# Patient Record
Sex: Male | Born: 1985 | Race: White | Hispanic: No | Marital: Married | Smoking: Never smoker
Health system: Southern US, Community
[De-identification: ages and names within clinical notes are randomized; demographics above are authoritative.]

## PROBLEM LIST (undated history)

## (undated) HISTORY — PX: FRENULECTOMY, LINGUAL: SHX1681

## (undated) HISTORY — PX: ADENOIDECTOMY: SHX5191

---

## 2006-06-30 ENCOUNTER — Ambulatory Visit: Payer: Self-pay | Admitting: Physician Assistant

## 2008-03-25 ENCOUNTER — Encounter: Admission: RE | Admit: 2008-03-25 | Discharge: 2008-03-25 | Payer: Self-pay | Admitting: Sports Medicine

## 2016-08-05 DIAGNOSIS — H5213 Myopia, bilateral: Secondary | ICD-10-CM

## 2016-08-05 HISTORY — DX: Myopia, bilateral: H52.13

## 2017-06-05 DIAGNOSIS — Z0279 Encounter for issue of other medical certificate: Secondary | ICD-10-CM

## 2017-06-05 HISTORY — DX: Encounter for issue of other medical certificate: Z02.79

## 2021-06-24 ENCOUNTER — Encounter (HOSPITAL_COMMUNITY): Payer: Self-pay | Admitting: *Deleted

## 2021-06-24 ENCOUNTER — Emergency Department (HOSPITAL_COMMUNITY): Payer: No Typology Code available for payment source

## 2021-06-24 ENCOUNTER — Emergency Department (HOSPITAL_COMMUNITY)
Admission: EM | Admit: 2021-06-24 | Discharge: 2021-06-24 | Disposition: A | Discharge status: Home / Self Care | Payer: No Typology Code available for payment source | Attending: Emergency Medicine | Admitting: Emergency Medicine

## 2021-06-24 DIAGNOSIS — Y9301 Activity, walking, marching and hiking: Secondary | ICD-10-CM | POA: Diagnosis not present

## 2021-06-24 DIAGNOSIS — R110 Nausea: Secondary | ICD-10-CM | POA: Insufficient documentation

## 2021-06-24 DIAGNOSIS — X58XXXA Exposure to other specified factors, initial encounter: Secondary | ICD-10-CM | POA: Insufficient documentation

## 2021-06-24 DIAGNOSIS — R55 Syncope and collapse: Secondary | ICD-10-CM | POA: Diagnosis not present

## 2021-06-24 DIAGNOSIS — R1031 Right lower quadrant pain: Secondary | ICD-10-CM | POA: Insufficient documentation

## 2021-06-24 DIAGNOSIS — Y990 Civilian activity done for income or pay: Secondary | ICD-10-CM | POA: Insufficient documentation

## 2021-06-24 DIAGNOSIS — R001 Bradycardia, unspecified: Secondary | ICD-10-CM | POA: Diagnosis not present

## 2021-06-24 DIAGNOSIS — R61 Generalized hyperhidrosis: Secondary | ICD-10-CM | POA: Insufficient documentation

## 2021-06-24 LAB — BASIC METABOLIC PANEL
Anion gap: 9 (ref 5–15)
BUN: 16 mg/dL (ref 6–20)
CO2: 22 mmol/L (ref 22–32)
Calcium: 9.3 mg/dL (ref 8.9–10.3)
Chloride: 108 mmol/L (ref 98–111)
Creatinine, Ser: 0.91 mg/dL (ref 0.61–1.24)
GFR, Estimated: >60 mL/min (ref >60)
Glucose, Bld: 143 mg/dL — ABNORMAL HIGH (ref 70–99)
Potassium: 3.4 mmol/L — ABNORMAL LOW (ref 3.5–5.1)
Sodium: 139 mmol/L (ref 135–145)

## 2021-06-24 LAB — CBC
HCT: 40.7 % (ref 39.0–52.0)
Hemoglobin: 14.1 g/dL (ref 13.0–17.0)
MCH: 30.5 pg (ref 26.0–34.0)
MCHC: 34.6 g/dL (ref 30.0–36.0)
MCV: 87.9 fL (ref 80.0–100.0)
Platelets: 169 10*3/uL (ref 150–400)
RBC: 4.63 MIL/uL (ref 4.22–5.81)
RDW: 11.7 % (ref 11.5–15.5)
WBC: 8.6 10*3/uL (ref 4.0–10.5)
nRBC: 0 % (ref 0.0–0.2)

## 2021-06-24 LAB — URINALYSIS, ROUTINE W REFLEX MICROSCOPIC
Bilirubin Urine: NEGATIVE
Glucose, UA: NEGATIVE mg/dL
Hgb urine dipstick: NEGATIVE
Ketones, ur: 20 mg/dL — AB
Leukocytes,Ua: NEGATIVE
Nitrite: NEGATIVE
Protein, ur: NEGATIVE mg/dL
Specific Gravity, Urine: 1.02 (ref 1.005–1.030)
pH: 6 (ref 5.0–8.0)

## 2021-06-24 LAB — HEPATIC FUNCTION PANEL
ALT: 33 U/L (ref 0–44)
AST: 31 U/L (ref 15–41)
Albumin: 4.9 g/dL (ref 3.5–5.0)
Alkaline Phosphatase: 44 U/L (ref 38–126)
Bilirubin, Direct: 0.1 mg/dL (ref 0.0–0.2)
Indirect Bilirubin: 0.5 mg/dL (ref 0.3–0.9)
Total Bilirubin: 0.6 mg/dL (ref 0.3–1.2)
Total Protein: 7.2 g/dL (ref 6.5–8.1)

## 2021-06-24 LAB — TROPONIN I (HIGH SENSITIVITY): Troponin I (High Sensitivity): 6 ng/L (ref <18)

## 2021-06-24 LAB — LIPASE, BLOOD: Lipase: 28 U/L (ref 11–51)

## 2021-06-24 NOTE — Discharge Instructions (Addendum)
You came to the emergency department today to be evaluated for your episode of syncope.  Your physical exam, lab work, chest x-ray, and EKG were reassuring.  Please follow-up with your primary care provider within appointment as soon as possible.    Get help right away if you: Have a severe headache. Faint once or repeatedly. Have pain in your chest, abdomen, or back. Have a very fast or irregular heartbeat (palpitations). Have pain when you breathe. Are bleeding from your mouth or rectum, or you have black or tarry stool. Have a seizure. Are confused. Have trouble walking. Have severe weakness. Have vision problems.

## 2021-06-24 NOTE — ED Provider Notes (Signed)
[facility]-EMERGENCY DEPT Provider Note   CSN: [id number] Arrival date & time: 06/24/21  1135     History Chief Complaint  Patient presents with   Loss of Consciousness    Cole Baird is a 35 y.o. male with no reported medical history.  Patient presents to the emergency room with a chief complaint of syncope.  Patient reports that syncopal episode occurred after he was walking outside in the heat for approximately 1 and half hours.  Patient reports that episode occurred approximately 1030.  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  He started feeling lightheaded and nauseous.  Had episode of syncope.  Syncopal episode was witnessed by his coworker.  Syncopal episode lasted approximately a few minutes.  No reported seizure-like activity.  Patient was able to stand and ambulate after the event.  Continued to have lightheadedness r his syncopal episode however these symptoms resolved after receiving 500 mL fluid with EMS in route to emergency department.  Patient endorsed having right lower quadrant abdominal discomfort prior to his episode of syncope.  Patient had no associated chest pain, shortness of breath, palpitations, sudden onset of headache, vomiting.  Patient denies any history of PE/DVT, surgery in the last 12 weeks, cancer treatment, hormone therapy use, hemoptysis, unilateral leg swelling or tenderness.   Patient denies illicit drug use, tobacco use.  Reports occasional alcohol use.   Loss of Consciousness Associated symptoms: diaphoresis and nausea   Associated symptoms: no chest pain, no confusion, no dizziness, no fever, no headaches, no palpitations, no seizures, no shortness of breath, no vomiting and no weakness       History reviewed. No pertinent past medical history.  There are no problems to display for this patient.   History reviewed. No pertinent surgical  history.     No family history on file.     Home Medications Prior to Admission medications   Not on File    Allergies    Patient has no allergy information on record.  Review of Systems   Review of Systems  Constitutional:  Positive for diaphoresis. Negative for chills and fever.  Eyes:  Negative for visual disturbance.  Respiratory:  Negative for cough and shortness of breath.   Cardiovascular:  Positive for syncope. Negative for chest pain, palpitations and leg swelling.  Gastrointestinal:  Positive for abdominal pain and nausea. Negative for vomiting.  Genitourinary:  Negative for difficulty urinating.  Musculoskeletal:  Negative for back pain and neck pain.  Skin:  Negative for color change and rash.  Allergic/Immunologic: Negative for immunocompromised state.  Neurological:  Positive for syncope and light-headedness. Negative for dizziness, tremors, seizures, facial asymmetry, speech difficulty, weakness, numbness and headaches.  Psychiatric/Behavioral:  Negative for confusion.    Physical Exam Updated Vital Signs BP (!) 135/92   Pulse (!) 56   Temp 97.6 F (36.4 C) (Oral)   Resp 18   SpO2 99%   Physical Exam Vitals and nursing note reviewed.  Constitutional:      General: He is not in acute distress.    Appearance: He is not ill-appearing, toxic-appearing or diaphoretic.  HENT:     Head: Normocephalic and atraumatic.  Eyes:     General: No scleral icterus.       Right eye: No discharge.        Left eye: No discharge.  Cardiovascular:     Rate and  Rhythm: Bradycardia present.     Pulses:          Radial pulses are 2+ on the right side and 2+ on the left side.       Femoral pulses are 2+ on the right side and 2+ on the left side.    Heart sounds: Normal heart sounds.     Comments: Bradycardic at rate of 56 Pulmonary:     Effort: Pulmonary effort is normal. No tachypnea, bradypnea or respiratory distress.     Breath sounds: Normal breath sounds. No  stridor.  Abdominal:     General: Abdomen is flat. Bowel sounds are normal. There is no distension. There are no signs of injury.     Palpations: Abdomen is soft. There is no mass or pulsatile mass.     Tenderness: There is no abdominal tenderness. There is no guarding or rebound. Negative signs include McBurney's sign.     Hernia: There is no hernia in the umbilical area or ventral area.  Musculoskeletal:     Cervical back: Neck supple.     Right lower leg: No swelling or tenderness. No edema.     Left lower leg: No swelling or tenderness. No edema.  Skin:    General: Skin is warm and dry.  Neurological:     General: No focal deficit present.     Mental Status: He is alert and oriented to person, place, and time.     GCS: GCS eye subscore is 4. GCS verbal subscore is 5. GCS motor subscore is 6.  Psychiatric:        Behavior: Behavior is cooperative.    ED Results / Procedures / Treatments   Labs (all labs ordered are listed, but only abnormal results are displayed) Labs Reviewed  BASIC METABOLIC PANEL - Abnormal; Notable for the following components:      Result Value   Potassium 3.4 (*)    Glucose, Bld 143 (*)    All other components within normal limits  URINALYSIS, ROUTINE W REFLEX MICROSCOPIC - Abnormal; Notable for the following components:   Ketones, ur 20 (*)    All other components within normal limits  CBC  HEPATIC FUNCTION PANEL  LIPASE, BLOOD  TROPONIN I (HIGH SENSITIVITY)    EKG EKG Interpretation  Date/Time:  Thursday June 24 2021 11:50:56 EDT Ventricular Rate:  58 PR Interval:  189 QRS Duration: 97 QT Interval:  421 QTC Calculation: 414 R Axis:   82 Text Interpretation: Sinus rhythm Minimal ST depression, inferior leads 12 Lead; Mason-Likar No previous tracing Confirmed by Gwyneth Sprout ([id number]) on 06/24/2021 1:17:17 PM  Radiology DG Chest 2 View  Result Date: 06/24/2021 CLINICAL DATA:  Syncope. EXAM: CHEST - 2 VIEW COMPARISON:  None. FINDINGS:  The heart size and mediastinal contours are within normal limits. Both lungs are clear. No visible pleural effusions or pneumothorax on these semi-erect radiographs. No acute osseous abnormality. IMPRESSION: No evidence of acute cardiopulmonary disease. Electronically Signed   By: Feliberto Harts MD   On: 06/24/2021 13:51    Procedures Procedures   Medications Ordered in ED Medications - No data to display  ED Course  I have reviewed the triage vital signs and the nursing notes.  Pertinent labs & imaging results that were available during my care of the patient were reviewed by me and considered in my medical decision making (see chart for details).    MDM Rules/Calculators/A&P  Alert 35 year old male in no acute distress, nontoxic-appearing.  Patient presents emerged part with a chief complaint of syncopal episode.  He denies any medical history.  No illicit drug use, tobacco use.  Patient denies any symptoms of lightheadedness at present.    Patient was alert ambulatory after syncopal episode.  No fall or traumatic injury associated with syncopal episode.  Syncope was witnessed by coworker, no No reported seizure-like activity.  Solution of lightheadedness after receiving 500 mL fluid bolus with EMS.  CBC, hepatic function, lipase are all unremarkable. BMP shows potassium slightly lower at 3.4 Troponin 6 Urinalysis shows ketones present. EKG shows normal sinus rhythm with minimal ST depression and inferior leads. Chest x-ray shows no active cardiopulmonary disease. Orthostatic vital signs negative.  Patient has no further episodes of lightheadedness or syncopal episodes while in emergency department.  Follow-up closely with primary care provider.  Patient given strict return precautions.  Patient expressed understanding of all instructions and is agreeable with this plan.   Final Clinical Impression(s) / ED Diagnoses Final diagnoses:  Syncope, unspecified  syncope type    Rx / DC Orders ED Discharge Orders     None        Berneice Heinrich 06/24/21 1526    Gwyneth Sprout, MD 06/27/21 2206

## 2021-06-24 NOTE — ED Provider Notes (Signed)
Emergency Medicine Provider Triage Evaluation Note  Cole Baird , a 35 y.o. male  was evaluated in triage.  Pt complains of syncope.  Patient was working outside and he afterwards she moved into a vehicle.  On the vehicle he started feeling lightheaded and nauseous.  Patient had an episode of syncope.  Did not fall or injure himself.  Patient is unsure how long this episode of syncope lasted for.  Syncope was witnessed by his coworker.  Denies any preceding chest pain, palpitations, shortness of breath, chest pain, sudden onset headache.  Patient states that he did have some lower quadrant abdominal pain discomfort prior to his episode of syncope.  Patient denies any pain or discomfort at present.  Denies any medical history.  Review of Systems  Positive: Syncope, lightheadedness, nausea, Negative: Chest pain, shortness of breath, palpitations, vomiting  Physical Exam  BP 132/81 (BP Location: Left Arm)   Pulse (!) 55   Temp 97.6 F (36.4 C) (Oral)   Resp 18   SpO2 100%  Gen:   Awake, no distress   Resp:  Normal effort, lungs clear to auscultation bilaterally MSK:   Moves extremities without difficulty  Other:  Abdomen soft, nondistended, nontender.  No guarding or rebound tenderness.  Medical Decision Making  Medically screening exam initiated at 12:15 PM.  Appropriate orders placed.  Cole Baird was informed that the remainder of the evaluation will be completed by another provider, this initial triage assessment does not replace that evaluation, and the importance of remaining in the ED until their evaluation is complete.  The patient appears stable so that the remainder of the work up may be completed by another provider.      Cole Baird 06/24/21 1217    Gwyneth Sprout, MD 06/27/21 2205

## 2021-06-24 NOTE — ED Notes (Signed)
Patient is gone to xray 

## 2021-06-24 NOTE — ED Triage Notes (Signed)
Per EMS, pt felt nauseas, had right lower abdominal pain while at work outside, then blacked out after sitting down in his truck. Pt was diaphoretic, A&Ox4 upon EMS arrival. Received 500cc NS en route. Reports feeling better.   BP 164/90 Hr 64 RR 16 O2 100% RA CBG 125

## 2021-07-06 ENCOUNTER — Encounter: Payer: Self-pay | Admitting: *Deleted

## 2021-07-06 ENCOUNTER — Encounter: Payer: Self-pay | Admitting: Cardiology

## 2021-07-30 ENCOUNTER — Other Ambulatory Visit: Payer: Self-pay

## 2021-07-30 ENCOUNTER — Encounter: Payer: Self-pay | Admitting: Cardiology

## 2021-07-30 ENCOUNTER — Ambulatory Visit: Payer: BC Managed Care – PPO | Admitting: Cardiology

## 2021-07-30 DIAGNOSIS — R011 Cardiac murmur, unspecified: Secondary | ICD-10-CM

## 2021-07-30 DIAGNOSIS — R55 Syncope and collapse: Secondary | ICD-10-CM

## 2021-07-30 DIAGNOSIS — R079 Chest pain, unspecified: Secondary | ICD-10-CM | POA: Insufficient documentation

## 2021-07-30 HISTORY — DX: Cardiac murmur, unspecified: R01.1

## 2021-07-30 HISTORY — DX: Syncope and collapse: R55

## 2021-07-30 HISTORY — DX: Chest pain, unspecified: R07.9

## 2021-07-30 NOTE — Progress Notes (Signed)
Cardiology Office Note:    Date:  07/30/2021   ID:  Cole Baird, DOB 08-30-1986, MRN [id number]  PCP:  Cole Handler, NP  Cardiologist:  Cole Brothers, MD   Referring MD: Cole Handler, NP    ASSESSMENT:    1. Syncope and collapse   2. Cardiac murmur   3. Chest pain of uncertain etiology    PLAN:    In order of problems listed above:  Primary prevention stressed with the patient.  Importance of compliance with diet medication stressed and he vocalized understanding. Syncope: Appears to be vasovagal.  He is a very healthy gentleman and active.  And with a lot of activity has no significant symptoms.  I told him to keep himself well-hydrated especially when he is working in significant amount of heat like he was doing the other day.  The symptoms have never happened before. Chest discomfort: Atypical in etiology.  Does not occur on exertion and we will do a stress echo to assess reassuring. Cardiac murmur: Echocardiogram will be done to assess murmur heard on auscultation. Patient will be seen in follow-up appointment in 6 months or earlier if the patient has any concerns    Medication Adjustments/Labs and Tests Ordered: Current medicines are reviewed at length with the patient today.  Concerns regarding medicines are outlined above.  No orders of the defined types were placed in this encounter.  No orders of the defined types were placed in this encounter.    Chief Complaint  Patient presents with   Loss of Consciousness     History of Present Illness:    Cole Baird is a 35 y.o. male.  Patient has past medical history that is not much significant.  He denies any history of hypertension dyslipidemia or diabetes mellitus.  He is an active gentleman.  He mentions to me that he was out working in the heat and went into the truck to get some air.  Subsequently felt lightheaded and got a feeling that he is going to pass out.  Subsequently has passed out.  He was out  for about 2 minutes according to his coworker.  When ambulance came he was fine but feeling a little dizzy and tired.  He tried to sit up and stand and felt dizzy again and laid down.  Subsequently has not had any such issues and before this he has not had any issues with that.  At the time of my evaluation, the patient is alert awake oriented and in no distress.  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     Allergies:   Patient has no known allergies.   Social History   Socioeconomic History   Marital status: Married    Spouse name: Not on file   Number of children: Not on file   Years of education: Not on file   Highest education level: Not on file  Occupational History   Not on file  Tobacco Use   Smoking status: Never   Smokeless tobacco: Current    Types: Chew  Substance and Sexual Activity   Alcohol use: Yes    Comment: Rare  Drug use: Never   Sexual activity: Yes  Other Topics Concern   Not on file  Social History Narrative   Not on file   Social Determinants of Health   Financial Resource Strain: Not on file  Food Insecurity: Not on file  Transportation Needs: Not on file  Physical Activity: Not on file  Stress: Not on file  Social Connections: Not on file     Family History: The patient's family history includes Clotting disorder in his maternal uncle; Heart failure in his maternal uncle.  ROS:   Please see the history of present illness.    All other systems reviewed and are negative.  EKGs/Labs/Other Studies Reviewed:    The following studies were reviewed today: EKG reveals sinus rhythm with nonspecific ST-T changes   Recent Labs: 06/24/2021: ALT 33; BUN 16;  Creatinine, Ser 0.91; Hemoglobin 14.1; Platelets 169; Potassium 3.4; Sodium 139  Recent Lipid Panel No results found for: CHOL, TRIG, HDL, CHOLHDL, VLDL, LDLCALC, LDLDIRECT  Physical Exam:    VS:  BP (!) 144/88 (BP Location: Left Arm, Patient Position: Sitting)   Pulse 61   Ht 6' (1.829 m)   Wt 173 lb 9.6 oz (78.7 kg)   SpO2 98%   BMI 23.54 kg/m     Wt Readings from Last 3 Encounters:  07/30/21 173 lb 9.6 oz (78.7 kg)  06/30/21 172 lb (78 kg)     GEN: Patient is in no acute distress HEENT: Normal NECK: No JVD; No carotid bruits LYMPHATICS: No lymphadenopathy CARDIAC: Hear sounds regular, 2/6 systolic murmur at the apex. RESPIRATORY:  Clear to auscultation without rales, wheezing or rhonchi  ABDOMEN: Soft, non-tender, non-distended MUSCULOSKELETAL:  No edema; No deformity  SKIN: Warm and dry NEUROLOGIC:  Alert and oriented x 3 PSYCHIATRIC:  Normal affect   Signed, Cole Brothers, MD  07/30/2021 4:11 PM    [facility]

## 2021-07-30 NOTE — Patient Instructions (Signed)
Medication Instructions:  No medication changes. *If you need a refill on your cardiac medications before your next appointment, please call your pharmacy*   Lab Work: None ordered If you have labs (blood work) drawn today and your tests are completely normal, you will receive your results only by: MyChart Message (if you have MyChart) OR A paper copy in the mail If you have any lab test that is abnormal or we need to change your treatment, we will call you to review the results.   Testing/Procedures:     Stress Echocardiogram Information Sheet                                                      Instructions:   Nothing to eat or drink after midnight the day before your test.  Dress prepared to exercise.  Please bring all current prescription medications.  This will be scheduled at [facility].  Your physician has requested that you have an echocardiogram. Echocardiography is a painless test that uses sound waves to create images of your heart. It provides your doctor with information about the size and shape of your heart and how well your heart's chambers and valves are working. This procedure takes approximately one hour. There are no restrictions for this procedure.   Follow-Up: At [facility] you and your health needs are our priority.  As part of our continuing mission to provide you with exceptional heart care, we have created designated Provider Care Teams.  These Care Teams include your primary Cardiologist (physician) and Advanced Practice Providers (APPs -  Physician Assistants and Nurse Practitioners) who all work together to provide you with the care you need, when you need it.  We recommend signing up for the patient portal called "MyChart".  Sign up information is provided on this After Visit Summary.  MyChart is used to connect with patients for Virtual Visits (Telemedicine).  Patients are able to view lab/test results, encounter notes, upcoming appointments,  etc.  Non-urgent messages can be sent to your provider as well.   To learn more about what you can do with MyChart, go to [email or web address].    Your next appointment:   6 month(s)  The format for your next appointment:   In Person  Provider:   Belva Crome, MD   Other Instructions NA

## 2021-08-18 ENCOUNTER — Telehealth: Payer: Self-pay

## 2021-08-18 ENCOUNTER — Other Ambulatory Visit: Payer: Self-pay

## 2021-08-18 ENCOUNTER — Ambulatory Visit (INDEPENDENT_AMBULATORY_CARE_PROVIDER_SITE_OTHER): Payer: BC Managed Care – PPO

## 2021-08-18 DIAGNOSIS — R55 Syncope and collapse: Secondary | ICD-10-CM

## 2021-08-18 DIAGNOSIS — R011 Cardiac murmur, unspecified: Secondary | ICD-10-CM | POA: Diagnosis not present

## 2021-08-18 DIAGNOSIS — R079 Chest pain, unspecified: Secondary | ICD-10-CM | POA: Diagnosis not present

## 2021-08-18 LAB — ECHOCARDIOGRAM COMPLETE
Area-P 1/2: 3.87 cm2
S' Lateral: 3.6 cm

## 2021-08-18 NOTE — Telephone Encounter (Signed)
Patient notified of results. Copy sent to PCP 

## 2021-08-18 NOTE — Telephone Encounter (Signed)
-----   Message from Rajan R Revankar, MD sent at 08/18/2021  4:50 PM EDT ----- The results of the study is unremarkable. Please inform patient. I will discuss in detail at next appointment. Cc  primary care/referring physician Rajan R Revankar, MD 08/18/2021 4:50 PM  

## 2022-02-04 ENCOUNTER — Ambulatory Visit: Payer: BC Managed Care – PPO | Admitting: Cardiology

## 2023-05-23 IMAGING — CR DG CHEST 2V
3 series · 3 of 3 positions shown · non-contrast
Comparison: None.

CLINICAL DATA: Syncope.

EXAM:
CHEST - 2 VIEW

[w chest lat]
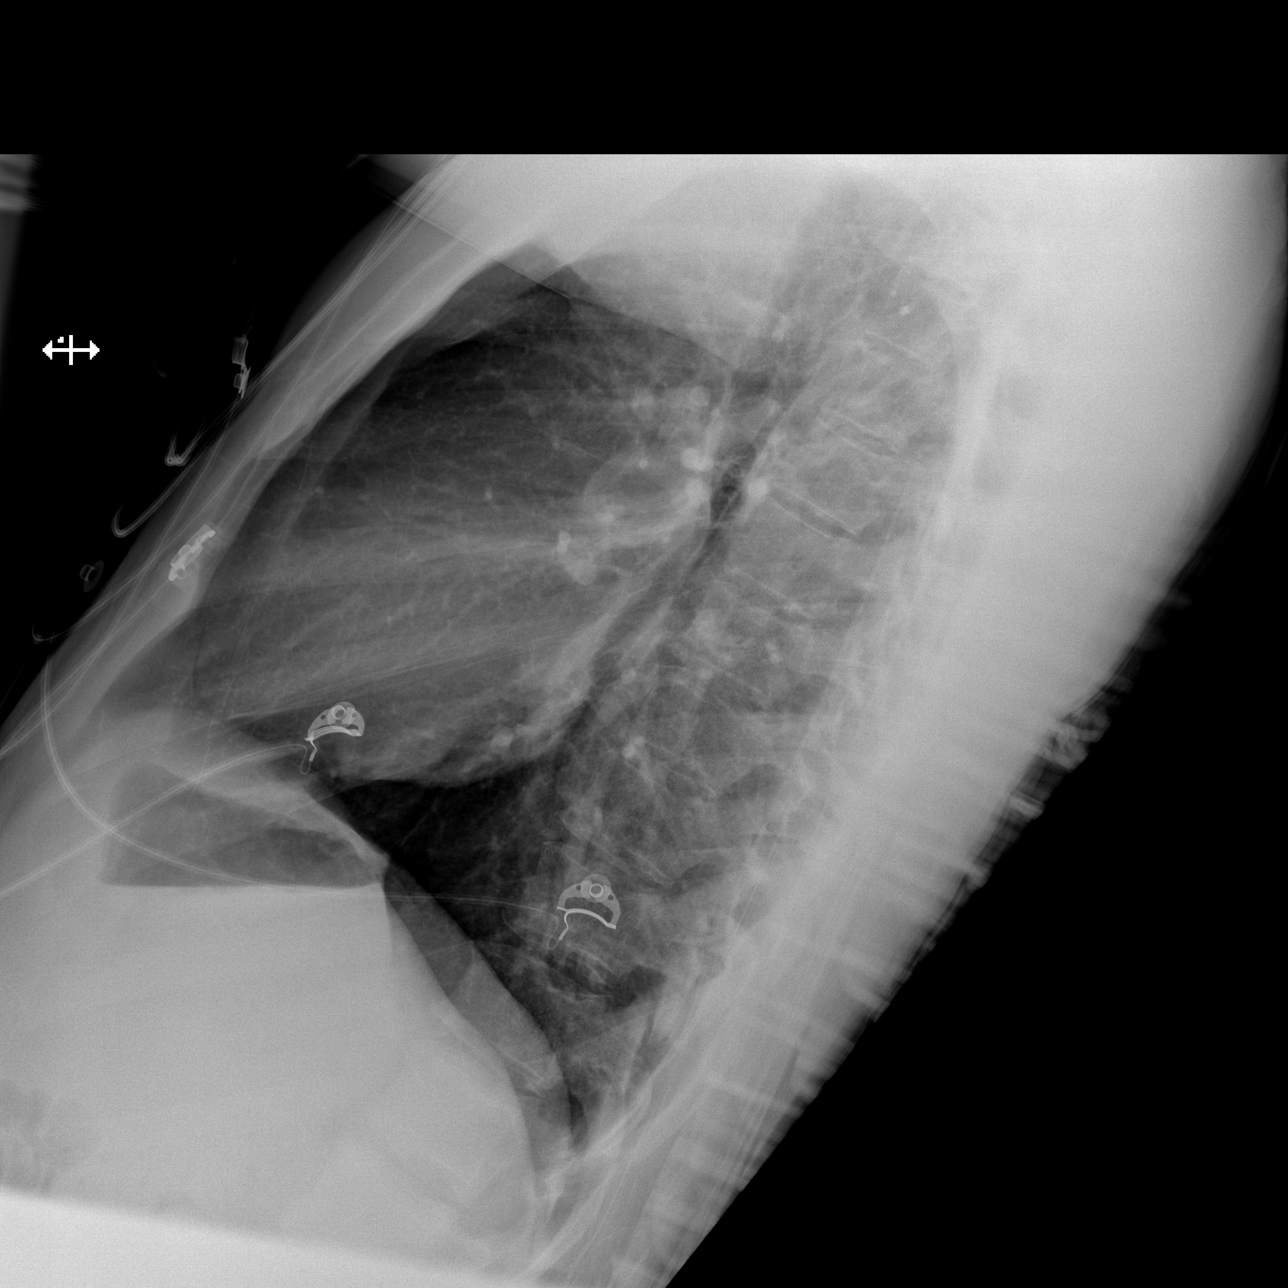

[x chest ap (1 of 2)]
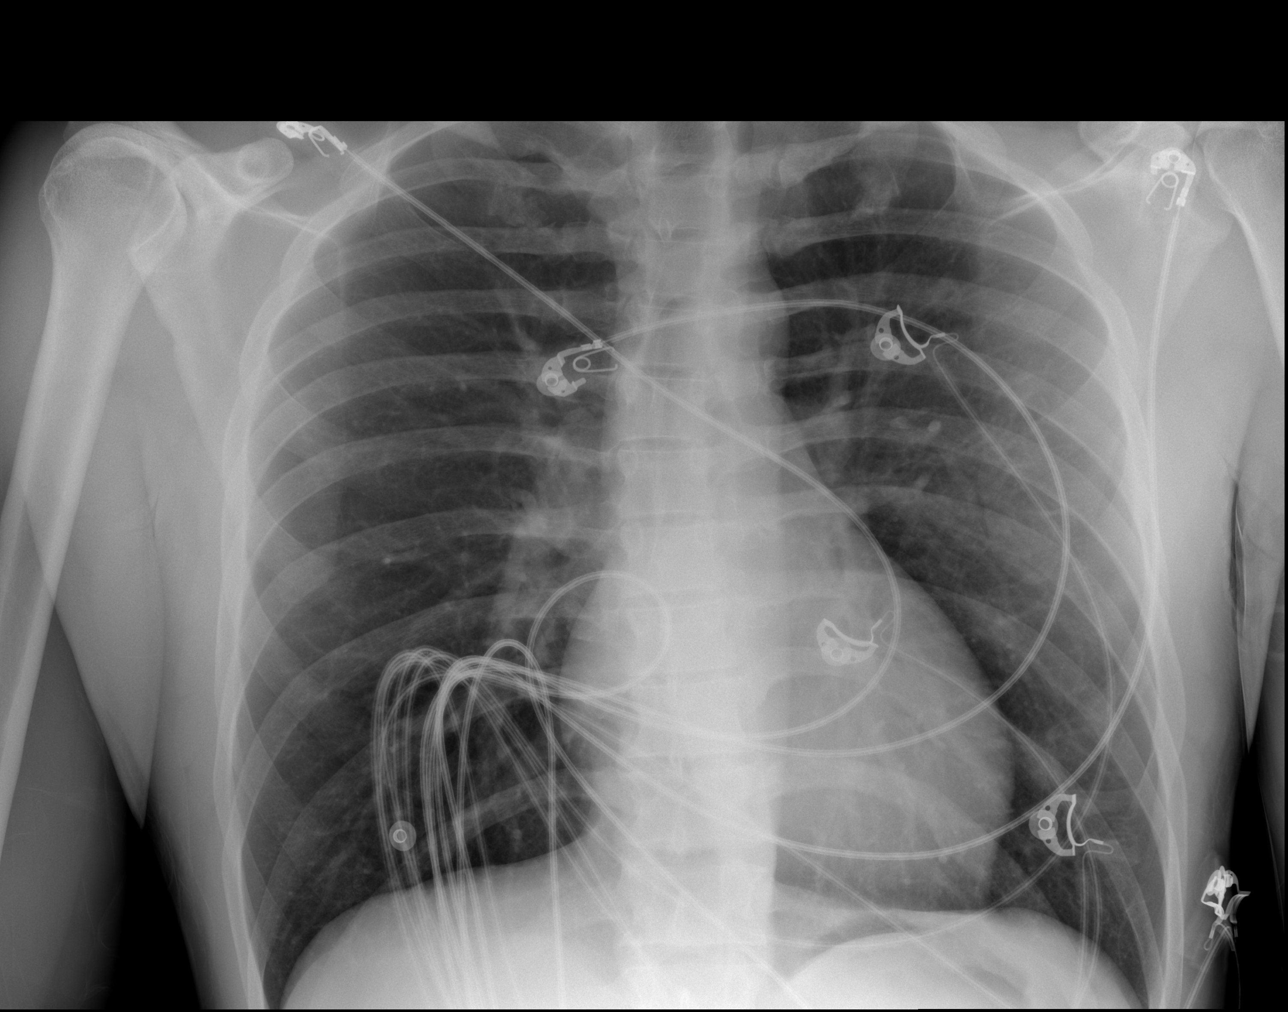

[x chest ap (2 of 2)]
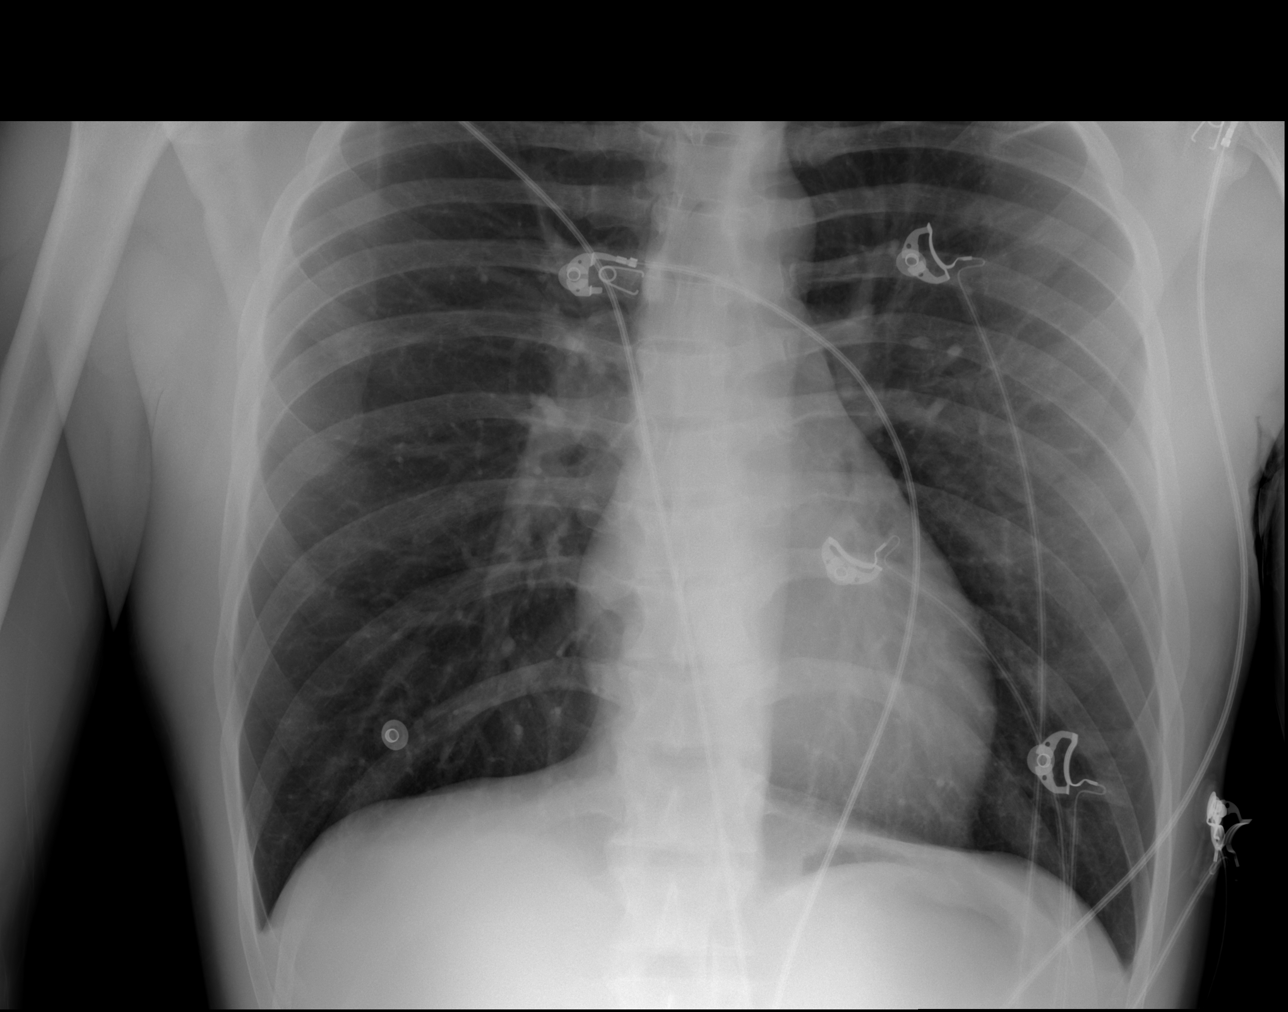

[3 of 3 positions shown; findings below may reference images not displayed]

FINDINGS: The heart size and mediastinal contours are within normal limits.
Both lungs are clear. No visible pleural effusions or pneumothorax
on these semi-erect radiographs. No acute osseous abnormality.
IMPRESSION: No evidence of acute cardiopulmonary disease.

## 2023-08-10 ENCOUNTER — Ambulatory Visit: Payer: Self-pay

## 2023-08-10 ENCOUNTER — Other Ambulatory Visit: Payer: Self-pay | Admitting: Family Medicine

## 2023-08-10 DIAGNOSIS — M79645 Pain in left finger(s): Secondary | ICD-10-CM
